# Patient Record
Sex: Female | Born: 1960 | Race: Black or African American | Hispanic: No | Marital: Married | Smoking: Never smoker
Health system: Southern US, Community
[De-identification: ages and names within clinical notes are randomized; demographics above are authoritative.]

---

## 1998-05-21 ENCOUNTER — Other Ambulatory Visit: Admission: RE | Admit: 1998-05-21 | Discharge: 1998-05-21 | Payer: Self-pay | Admitting: *Deleted

## 1998-10-18 ENCOUNTER — Other Ambulatory Visit: Admission: RE | Admit: 1998-10-18 | Discharge: 1998-10-18 | Payer: Self-pay | Admitting: *Deleted

## 2000-04-23 ENCOUNTER — Encounter: Admission: RE | Admit: 2000-04-23 | Discharge: 2000-04-23 | Payer: Self-pay | Admitting: Family Medicine

## 2000-04-23 ENCOUNTER — Encounter: Payer: Self-pay | Admitting: Family Medicine

## 2001-05-15 ENCOUNTER — Encounter: Admission: RE | Admit: 2001-05-15 | Discharge: 2001-05-15 | Payer: Self-pay | Admitting: Family Medicine

## 2001-05-15 ENCOUNTER — Encounter: Payer: Self-pay | Admitting: Family Medicine

## 2002-05-20 ENCOUNTER — Encounter: Payer: Self-pay | Admitting: Family Medicine

## 2002-05-20 ENCOUNTER — Encounter: Admission: RE | Admit: 2002-05-20 | Discharge: 2002-05-20 | Payer: Self-pay | Admitting: Family Medicine

## 2003-06-02 ENCOUNTER — Encounter: Admission: RE | Admit: 2003-06-02 | Discharge: 2003-06-02 | Payer: Self-pay | Admitting: Family Medicine

## 2003-06-16 ENCOUNTER — Ambulatory Visit (HOSPITAL_COMMUNITY): Admission: RE | Admit: 2003-06-16 | Discharge: 2003-06-16 | Payer: Self-pay | Admitting: Family Medicine

## 2003-06-30 ENCOUNTER — Ambulatory Visit (HOSPITAL_COMMUNITY): Admission: RE | Admit: 2003-06-30 | Discharge: 2003-06-30 | Payer: Self-pay | Admitting: Family Medicine

## 2004-06-28 ENCOUNTER — Ambulatory Visit (HOSPITAL_COMMUNITY): Admission: RE | Admit: 2004-06-28 | Discharge: 2004-06-28 | Payer: Self-pay | Admitting: Family Medicine

## 2004-06-28 ENCOUNTER — Other Ambulatory Visit: Admission: RE | Admit: 2004-06-28 | Discharge: 2004-06-28 | Payer: Self-pay | Admitting: Family Medicine

## 2005-06-28 ENCOUNTER — Other Ambulatory Visit: Admission: RE | Admit: 2005-06-28 | Discharge: 2005-06-28 | Payer: Self-pay | Admitting: Family Medicine

## 2005-07-04 ENCOUNTER — Ambulatory Visit (HOSPITAL_COMMUNITY): Admission: RE | Admit: 2005-07-04 | Discharge: 2005-07-04 | Payer: Self-pay | Admitting: Family Medicine

## 2006-06-28 ENCOUNTER — Other Ambulatory Visit: Admission: RE | Admit: 2006-06-28 | Discharge: 2006-06-28 | Payer: Self-pay | Admitting: Family Medicine

## 2006-08-01 ENCOUNTER — Ambulatory Visit (HOSPITAL_COMMUNITY): Admission: RE | Admit: 2006-08-01 | Discharge: 2006-08-01 | Payer: Self-pay | Admitting: Obstetrics & Gynecology

## 2007-07-01 ENCOUNTER — Other Ambulatory Visit: Admission: RE | Admit: 2007-07-01 | Discharge: 2007-07-01 | Payer: Self-pay | Admitting: Family Medicine

## 2007-08-07 ENCOUNTER — Ambulatory Visit (HOSPITAL_COMMUNITY): Admission: RE | Admit: 2007-08-07 | Discharge: 2007-08-07 | Payer: Self-pay | Admitting: Family Medicine

## 2008-06-30 ENCOUNTER — Other Ambulatory Visit: Admission: RE | Admit: 2008-06-30 | Discharge: 2008-06-30 | Payer: Self-pay | Admitting: Family Medicine

## 2008-08-11 ENCOUNTER — Ambulatory Visit (HOSPITAL_COMMUNITY): Admission: RE | Admit: 2008-08-11 | Discharge: 2008-08-11 | Payer: Self-pay | Admitting: Family Medicine

## 2009-08-02 ENCOUNTER — Other Ambulatory Visit: Admission: RE | Admit: 2009-08-02 | Discharge: 2009-08-02 | Payer: Self-pay | Admitting: Family Medicine

## 2009-08-26 ENCOUNTER — Ambulatory Visit (HOSPITAL_COMMUNITY): Admission: RE | Admit: 2009-08-26 | Discharge: 2009-08-26 | Payer: Self-pay | Admitting: Internal Medicine

## 2010-07-28 ENCOUNTER — Other Ambulatory Visit (HOSPITAL_COMMUNITY): Payer: Self-pay | Admitting: Family Medicine

## 2010-07-28 DIAGNOSIS — Z1239 Encounter for other screening for malignant neoplasm of breast: Secondary | ICD-10-CM

## 2010-07-28 DIAGNOSIS — Z1231 Encounter for screening mammogram for malignant neoplasm of breast: Secondary | ICD-10-CM

## 2010-08-03 ENCOUNTER — Other Ambulatory Visit: Payer: Self-pay | Admitting: Family Medicine

## 2010-08-03 ENCOUNTER — Other Ambulatory Visit (HOSPITAL_COMMUNITY)
Admission: RE | Admit: 2010-08-03 | Discharge: 2010-08-03 | Disposition: A | Discharge status: Home / Self Care | Payer: BC Managed Care – PPO | Source: Ambulatory Visit | Attending: Family Medicine | Admitting: Family Medicine

## 2010-08-03 DIAGNOSIS — Z124 Encounter for screening for malignant neoplasm of cervix: Secondary | ICD-10-CM | POA: Insufficient documentation

## 2010-08-29 ENCOUNTER — Ambulatory Visit (HOSPITAL_COMMUNITY): Admission: RE | Admit: 2010-08-29 | Payer: BC Managed Care – PPO | Source: Ambulatory Visit

## 2010-08-31 ENCOUNTER — Ambulatory Visit (HOSPITAL_COMMUNITY)
Admission: RE | Admit: 2010-08-31 | Discharge: 2010-08-31 | Disposition: A | Discharge status: Home / Self Care | Payer: BC Managed Care – PPO | Source: Ambulatory Visit | Attending: Family Medicine | Admitting: Family Medicine

## 2010-08-31 DIAGNOSIS — Z1231 Encounter for screening mammogram for malignant neoplasm of breast: Secondary | ICD-10-CM | POA: Insufficient documentation

## 2011-08-22 ENCOUNTER — Other Ambulatory Visit (HOSPITAL_COMMUNITY): Payer: Self-pay | Admitting: Family Medicine

## 2011-08-22 DIAGNOSIS — Z1231 Encounter for screening mammogram for malignant neoplasm of breast: Secondary | ICD-10-CM

## 2011-09-15 ENCOUNTER — Ambulatory Visit (HOSPITAL_COMMUNITY)
Admission: RE | Admit: 2011-09-15 | Discharge: 2011-09-15 | Disposition: A | Discharge status: Home / Self Care | Payer: BC Managed Care – PPO | Source: Ambulatory Visit | Attending: Family Medicine | Admitting: Family Medicine

## 2011-09-15 DIAGNOSIS — Z1231 Encounter for screening mammogram for malignant neoplasm of breast: Secondary | ICD-10-CM | POA: Insufficient documentation

## 2011-09-26 ENCOUNTER — Other Ambulatory Visit: Payer: Self-pay | Admitting: Family Medicine

## 2011-09-26 ENCOUNTER — Other Ambulatory Visit (HOSPITAL_COMMUNITY)
Admission: RE | Admit: 2011-09-26 | Discharge: 2011-09-26 | Disposition: A | Discharge status: Home / Self Care | Payer: BC Managed Care – PPO | Source: Ambulatory Visit | Attending: Family Medicine | Admitting: Family Medicine

## 2011-09-26 DIAGNOSIS — Z124 Encounter for screening for malignant neoplasm of cervix: Secondary | ICD-10-CM | POA: Insufficient documentation

## 2012-09-12 ENCOUNTER — Other Ambulatory Visit (HOSPITAL_COMMUNITY): Payer: Self-pay | Admitting: Family Medicine

## 2012-09-12 DIAGNOSIS — Z1231 Encounter for screening mammogram for malignant neoplasm of breast: Secondary | ICD-10-CM

## 2012-09-25 ENCOUNTER — Ambulatory Visit (HOSPITAL_COMMUNITY)
Admission: RE | Admit: 2012-09-25 | Discharge: 2012-09-25 | Disposition: A | Discharge status: Home / Self Care | Payer: BC Managed Care – PPO | Source: Ambulatory Visit | Attending: Family Medicine | Admitting: Family Medicine

## 2012-09-25 DIAGNOSIS — Z1231 Encounter for screening mammogram for malignant neoplasm of breast: Secondary | ICD-10-CM | POA: Insufficient documentation

## 2012-10-02 ENCOUNTER — Other Ambulatory Visit: Payer: Self-pay | Admitting: Family Medicine

## 2012-10-02 ENCOUNTER — Other Ambulatory Visit (HOSPITAL_COMMUNITY)
Admission: RE | Admit: 2012-10-02 | Discharge: 2012-10-02 | Disposition: A | Discharge status: Home / Self Care | Payer: BC Managed Care – PPO | Source: Ambulatory Visit | Attending: Family Medicine | Admitting: Family Medicine

## 2012-10-02 DIAGNOSIS — Z124 Encounter for screening for malignant neoplasm of cervix: Secondary | ICD-10-CM | POA: Insufficient documentation

## 2013-10-01 ENCOUNTER — Other Ambulatory Visit (HOSPITAL_COMMUNITY): Payer: Self-pay | Admitting: Family Medicine

## 2013-10-01 DIAGNOSIS — Z1231 Encounter for screening mammogram for malignant neoplasm of breast: Secondary | ICD-10-CM

## 2013-10-03 ENCOUNTER — Ambulatory Visit (HOSPITAL_COMMUNITY)
Admission: RE | Admit: 2013-10-03 | Discharge: 2013-10-03 | Disposition: A | Discharge status: Home / Self Care | Payer: BC Managed Care – PPO | Source: Ambulatory Visit | Attending: Family Medicine | Admitting: Family Medicine

## 2013-10-03 DIAGNOSIS — Z1231 Encounter for screening mammogram for malignant neoplasm of breast: Secondary | ICD-10-CM | POA: Insufficient documentation

## 2013-10-07 ENCOUNTER — Other Ambulatory Visit (HOSPITAL_COMMUNITY)
Admission: RE | Admit: 2013-10-07 | Discharge: 2013-10-07 | Disposition: A | Discharge status: Home / Self Care | Payer: BC Managed Care – PPO | Source: Ambulatory Visit | Attending: Family Medicine | Admitting: Family Medicine

## 2013-10-07 ENCOUNTER — Other Ambulatory Visit: Payer: Self-pay | Admitting: Family Medicine

## 2013-10-07 DIAGNOSIS — Z124 Encounter for screening for malignant neoplasm of cervix: Secondary | ICD-10-CM | POA: Insufficient documentation

## 2014-10-16 ENCOUNTER — Other Ambulatory Visit: Payer: Self-pay | Admitting: Gastroenterology

## 2014-11-04 ENCOUNTER — Other Ambulatory Visit (HOSPITAL_COMMUNITY): Payer: Self-pay | Admitting: Family Medicine

## 2014-11-04 DIAGNOSIS — Z1231 Encounter for screening mammogram for malignant neoplasm of breast: Secondary | ICD-10-CM

## 2014-11-11 ENCOUNTER — Ambulatory Visit (HOSPITAL_COMMUNITY)
Admission: RE | Admit: 2014-11-11 | Discharge: 2014-11-11 | Disposition: A | Discharge status: Home / Self Care | Payer: BLUE CROSS/BLUE SHIELD | Source: Ambulatory Visit | Attending: Family Medicine | Admitting: Family Medicine

## 2014-11-11 DIAGNOSIS — Z1231 Encounter for screening mammogram for malignant neoplasm of breast: Secondary | ICD-10-CM | POA: Insufficient documentation

## 2015-12-07 ENCOUNTER — Other Ambulatory Visit: Payer: Self-pay | Admitting: Family Medicine

## 2015-12-07 DIAGNOSIS — Z1231 Encounter for screening mammogram for malignant neoplasm of breast: Secondary | ICD-10-CM

## 2015-12-17 ENCOUNTER — Ambulatory Visit
Admission: RE | Admit: 2015-12-17 | Discharge: 2015-12-17 | Disposition: A | Discharge status: Home / Self Care | Payer: BLUE CROSS/BLUE SHIELD | Source: Ambulatory Visit | Attending: Family Medicine | Admitting: Family Medicine

## 2015-12-17 DIAGNOSIS — Z1231 Encounter for screening mammogram for malignant neoplasm of breast: Secondary | ICD-10-CM

## 2017-01-31 ENCOUNTER — Other Ambulatory Visit: Payer: Self-pay | Admitting: Family Medicine

## 2017-01-31 DIAGNOSIS — Z1231 Encounter for screening mammogram for malignant neoplasm of breast: Secondary | ICD-10-CM

## 2017-02-09 ENCOUNTER — Ambulatory Visit
Admission: RE | Admit: 2017-02-09 | Discharge: 2017-02-09 | Disposition: A | Discharge status: Home / Self Care | Payer: Self-pay | Source: Ambulatory Visit | Attending: Family Medicine | Admitting: Family Medicine

## 2017-02-09 DIAGNOSIS — Z1231 Encounter for screening mammogram for malignant neoplasm of breast: Secondary | ICD-10-CM

## 2017-12-14 ENCOUNTER — Other Ambulatory Visit: Payer: Self-pay | Admitting: Family Medicine

## 2017-12-14 ENCOUNTER — Other Ambulatory Visit (HOSPITAL_COMMUNITY)
Admission: RE | Admit: 2017-12-14 | Discharge: 2017-12-14 | Disposition: A | Discharge status: Home / Self Care | Payer: BLUE CROSS/BLUE SHIELD | Source: Ambulatory Visit | Attending: Family Medicine | Admitting: Family Medicine

## 2017-12-14 DIAGNOSIS — Z01419 Encounter for gynecological examination (general) (routine) without abnormal findings: Secondary | ICD-10-CM | POA: Diagnosis not present

## 2017-12-14 DIAGNOSIS — Z0000 Encounter for general adult medical examination without abnormal findings: Secondary | ICD-10-CM | POA: Diagnosis not present

## 2017-12-14 DIAGNOSIS — Z23 Encounter for immunization: Secondary | ICD-10-CM | POA: Diagnosis not present

## 2017-12-14 DIAGNOSIS — Z1159 Encounter for screening for other viral diseases: Secondary | ICD-10-CM | POA: Diagnosis not present

## 2017-12-14 DIAGNOSIS — E7800 Pure hypercholesterolemia, unspecified: Secondary | ICD-10-CM | POA: Diagnosis not present

## 2017-12-14 DIAGNOSIS — Z124 Encounter for screening for malignant neoplasm of cervix: Secondary | ICD-10-CM | POA: Diagnosis not present

## 2017-12-18 LAB — CYTOLOGY - PAP: Diagnosis: NEGATIVE

## 2018-01-09 ENCOUNTER — Encounter: Payer: BLUE CROSS/BLUE SHIELD | Admitting: Podiatry

## 2018-01-14 NOTE — Progress Notes (Signed)
This encounter was created in error - please disregard.

## 2018-01-17 ENCOUNTER — Ambulatory Visit (INDEPENDENT_AMBULATORY_CARE_PROVIDER_SITE_OTHER): Payer: BLUE CROSS/BLUE SHIELD

## 2018-01-17 ENCOUNTER — Encounter: Payer: Self-pay | Admitting: Podiatry

## 2018-01-17 ENCOUNTER — Other Ambulatory Visit: Payer: Self-pay | Admitting: Podiatry

## 2018-01-17 ENCOUNTER — Ambulatory Visit: Payer: BLUE CROSS/BLUE SHIELD | Admitting: Podiatry

## 2018-01-17 VITALS — HR 87 | Resp 16

## 2018-01-17 DIAGNOSIS — M79671 Pain in right foot: Secondary | ICD-10-CM

## 2018-01-17 DIAGNOSIS — M79672 Pain in left foot: Secondary | ICD-10-CM

## 2018-01-17 DIAGNOSIS — M722 Plantar fascial fibromatosis: Secondary | ICD-10-CM

## 2018-01-17 MED ORDER — DICLOFENAC SODIUM 75 MG PO TBEC: 75.0000 mg | DELAYED_RELEASE_TABLET | Freq: Two times a day (BID) | ORAL | 2 refills | Status: DC

## 2018-01-17 NOTE — Progress Notes (Signed)
   Subjective:    Patient ID: Tracey Callahan, female    DOB: March 01, 1961, 57 y.o.   MRN: [id number]  HPI    Review of Systems  All other systems reviewed and are negative.      Objective:   Physical Exam        Assessment & Plan:

## 2018-01-17 NOTE — Patient Instructions (Signed)

## 2018-01-18 NOTE — Progress Notes (Signed)
Subjective:   Patient ID: Tracey Callahan, female   DOB: 57 y.o.   MRN: [id number]   HPI Patient presents stating that the bottom of my heels at times bother me and it feels like the circulation stops and then they seem to get better.  It is hard to describe what the pain is exactly but it does at times bother me.  Patient does not smoke likes to be active    Review of Systems  All other systems reviewed and are negative.       Objective:  Physical Exam  Constitutional: She appears well-developed and well-nourished.  Cardiovascular: Intact distal pulses.  Pulmonary/Chest: Effort normal.  Musculoskeletal: Normal range of motion.  Neurological: She is alert.  Skin: Skin is warm.  Nursing note and vitals reviewed.   Neurovascular status intact muscle strength adequate patient found to have minimal discomfort plantar aspect both heels with only mild pain upon deep palpation.  Patient has good digital perfusion well oriented with no indications of circulatory disease     Assessment:  Probability for mild to moderate fasciitis but difficult to make complete determination     Plan:  I went ahead today and I recommended supportive shoes over-the-counter insoles and I placed on diclofenac 75 mg twice daily.  Reappoint and may require injection treatment customized orthotics or other treatments  X-rays were negative for signs of spurs or stress fracture and no arthritis was noted

## 2018-02-27 ENCOUNTER — Other Ambulatory Visit: Payer: Self-pay

## 2018-02-27 DIAGNOSIS — Z1231 Encounter for screening mammogram for malignant neoplasm of breast: Secondary | ICD-10-CM

## 2018-04-01 ENCOUNTER — Ambulatory Visit
Admission: RE | Admit: 2018-04-01 | Discharge: 2018-04-01 | Disposition: A | Discharge status: Home / Self Care | Payer: BLUE CROSS/BLUE SHIELD | Source: Ambulatory Visit | Attending: Family Medicine | Admitting: Family Medicine

## 2018-04-01 ENCOUNTER — Encounter: Payer: Self-pay | Admitting: Radiology

## 2018-04-01 DIAGNOSIS — Z1231 Encounter for screening mammogram for malignant neoplasm of breast: Secondary | ICD-10-CM | POA: Diagnosis not present

## 2018-07-01 DIAGNOSIS — G5761 Lesion of plantar nerve, right lower limb: Secondary | ICD-10-CM | POA: Diagnosis not present

## 2018-07-08 DIAGNOSIS — G5761 Lesion of plantar nerve, right lower limb: Secondary | ICD-10-CM | POA: Diagnosis not present

## 2018-07-08 DIAGNOSIS — M7751 Other enthesopathy of right foot: Secondary | ICD-10-CM | POA: Diagnosis not present

## 2018-11-21 DIAGNOSIS — G5761 Lesion of plantar nerve, right lower limb: Secondary | ICD-10-CM | POA: Diagnosis not present

## 2018-11-21 DIAGNOSIS — M7751 Other enthesopathy of right foot: Secondary | ICD-10-CM | POA: Diagnosis not present

## 2018-12-20 DIAGNOSIS — Z0000 Encounter for general adult medical examination without abnormal findings: Secondary | ICD-10-CM | POA: Diagnosis not present

## 2018-12-27 DIAGNOSIS — E7800 Pure hypercholesterolemia, unspecified: Secondary | ICD-10-CM | POA: Diagnosis not present

## 2018-12-27 DIAGNOSIS — Z6837 Body mass index (BMI) 37.0-37.9, adult: Secondary | ICD-10-CM | POA: Diagnosis not present

## 2018-12-27 DIAGNOSIS — Z79899 Other long term (current) drug therapy: Secondary | ICD-10-CM | POA: Diagnosis not present

## 2019-03-24 ENCOUNTER — Other Ambulatory Visit: Payer: Self-pay | Admitting: Family Medicine

## 2019-03-24 DIAGNOSIS — Z1231 Encounter for screening mammogram for malignant neoplasm of breast: Secondary | ICD-10-CM

## 2019-05-06 ENCOUNTER — Other Ambulatory Visit: Payer: Self-pay

## 2019-05-06 ENCOUNTER — Ambulatory Visit
Admission: RE | Admit: 2019-05-06 | Discharge: 2019-05-06 | Disposition: A | Discharge status: Home / Self Care | Payer: BC Managed Care – PPO | Source: Ambulatory Visit | Attending: Family Medicine | Admitting: Family Medicine

## 2019-05-06 DIAGNOSIS — Z1231 Encounter for screening mammogram for malignant neoplasm of breast: Secondary | ICD-10-CM

## 2019-10-03 ENCOUNTER — Ambulatory Visit: Payer: BC Managed Care – PPO | Attending: Internal Medicine

## 2019-10-03 DIAGNOSIS — Z23 Encounter for immunization: Secondary | ICD-10-CM

## 2019-10-03 NOTE — Progress Notes (Signed)
   Covid-19 Vaccination Clinic  Name:  Tracey Callahan    MRN: [id number] DOB: [birth year]  10/03/2019  Tracey Callahan was observed post Covid-19 immunization for 15 minutes without incident. She was provided with Vaccine Information Sheet and instruction to access the V-Safe system.   Tracey Callahan was instructed to call 911 with any severe reactions post vaccine: Marland Kitchen Difficulty breathing  . Swelling of face and throat  . A fast heartbeat  . A bad rash all over body  . Dizziness and weakness   Immunizations Administered    Name Date Dose VIS Date Route   Pfizer COVID-19 Vaccine 10/03/2019 12:08 PM 0.3 mL 06/13/2019 Intramuscular   Manufacturer: [employer], [employer]   Lot: [id number]   NDC: [address]-0

## 2019-10-29 ENCOUNTER — Ambulatory Visit: Payer: BC Managed Care – PPO | Attending: Internal Medicine

## 2019-10-29 DIAGNOSIS — Z23 Encounter for immunization: Secondary | ICD-10-CM

## 2019-10-29 NOTE — Progress Notes (Signed)
   Covid-19 Vaccination Clinic  Name:  DALICIA KISNER    MRN: [id number] DOB: 1960/08/20  10/29/2019  Ms. Guerrera was observed post Covid-19 immunization for 15 minutes without incident. She was provided with Vaccine Information Sheet and instruction to access the V-Safe system.   Ms. Gores was instructed to call 911 with any severe reactions post vaccine: Marland Kitchen Difficulty breathing  . Swelling of face and throat  . A fast heartbeat  . A bad rash all over body  . Dizziness and weakness   Immunizations Administered    Name Date Dose VIS Date Route   Pfizer COVID-19 Vaccine 10/29/2019  4:42 PM 0.3 mL 08/27/2018 Intramuscular   Manufacturer: [employer], [employer]   Lot: [id number]   NDC: [address]-8

## 2020-01-08 DIAGNOSIS — E7800 Pure hypercholesterolemia, unspecified: Secondary | ICD-10-CM | POA: Diagnosis not present

## 2020-01-08 DIAGNOSIS — Z0000 Encounter for general adult medical examination without abnormal findings: Secondary | ICD-10-CM | POA: Diagnosis not present

## 2020-04-22 ENCOUNTER — Other Ambulatory Visit: Payer: Self-pay | Admitting: Family Medicine

## 2020-04-22 DIAGNOSIS — Z1231 Encounter for screening mammogram for malignant neoplasm of breast: Secondary | ICD-10-CM

## 2020-05-21 ENCOUNTER — Ambulatory Visit
Admission: RE | Admit: 2020-05-21 | Discharge: 2020-05-21 | Disposition: A | Discharge status: Home / Self Care | Payer: BC Managed Care – PPO | Source: Ambulatory Visit | Attending: Family Medicine | Admitting: Family Medicine

## 2020-05-21 ENCOUNTER — Other Ambulatory Visit: Payer: Self-pay

## 2020-05-21 DIAGNOSIS — Z1231 Encounter for screening mammogram for malignant neoplasm of breast: Secondary | ICD-10-CM

## 2021-01-20 DIAGNOSIS — Z124 Encounter for screening for malignant neoplasm of cervix: Secondary | ICD-10-CM | POA: Diagnosis not present

## 2021-01-20 DIAGNOSIS — E7800 Pure hypercholesterolemia, unspecified: Secondary | ICD-10-CM | POA: Diagnosis not present

## 2021-01-20 DIAGNOSIS — Z0000 Encounter for general adult medical examination without abnormal findings: Secondary | ICD-10-CM | POA: Diagnosis not present

## 2021-05-02 ENCOUNTER — Other Ambulatory Visit: Payer: Self-pay | Admitting: Family Medicine

## 2021-05-02 DIAGNOSIS — Z1231 Encounter for screening mammogram for malignant neoplasm of breast: Secondary | ICD-10-CM

## 2021-06-10 ENCOUNTER — Ambulatory Visit
Admission: RE | Admit: 2021-06-10 | Discharge: 2021-06-10 | Disposition: A | Discharge status: Home / Self Care | Payer: BC Managed Care – PPO | Source: Ambulatory Visit | Attending: Family Medicine | Admitting: Family Medicine

## 2021-06-10 ENCOUNTER — Other Ambulatory Visit: Payer: Self-pay

## 2021-06-10 DIAGNOSIS — Z1231 Encounter for screening mammogram for malignant neoplasm of breast: Secondary | ICD-10-CM

## 2022-01-25 DIAGNOSIS — E7800 Pure hypercholesterolemia, unspecified: Secondary | ICD-10-CM | POA: Diagnosis not present

## 2022-01-25 DIAGNOSIS — Z0000 Encounter for general adult medical examination without abnormal findings: Secondary | ICD-10-CM | POA: Diagnosis not present

## 2022-07-26 ENCOUNTER — Other Ambulatory Visit: Payer: Self-pay | Admitting: Family Medicine

## 2022-07-26 DIAGNOSIS — Z1231 Encounter for screening mammogram for malignant neoplasm of breast: Secondary | ICD-10-CM

## 2022-09-14 ENCOUNTER — Ambulatory Visit
Admission: RE | Admit: 2022-09-14 | Discharge: 2022-09-14 | Disposition: A | Discharge status: Home / Self Care | Payer: BC Managed Care – PPO | Source: Ambulatory Visit | Attending: Family Medicine | Admitting: Family Medicine

## 2022-09-14 DIAGNOSIS — Z1231 Encounter for screening mammogram for malignant neoplasm of breast: Secondary | ICD-10-CM

## 2023-01-23 IMAGING — MG MM DIGITAL SCREENING BILAT W/ TOMO AND CAD
6 of 10 series · 6 of 30 positions shown · non-contrast
Comparison: Previous exam(s).

CLINICAL DATA: Screening.

EXAM:
DIGITAL SCREENING BILATERAL MAMMOGRAM WITH TOMOSYNTHESIS AND CAD
TECHNIQUE: Bilateral screening digital craniocaudal and mediolateral oblique
mammograms were obtained. Bilateral screening digital breast
tomosynthesis was performed. The images were evaluated with
computer-aided detection.

[L CC synth-2D]
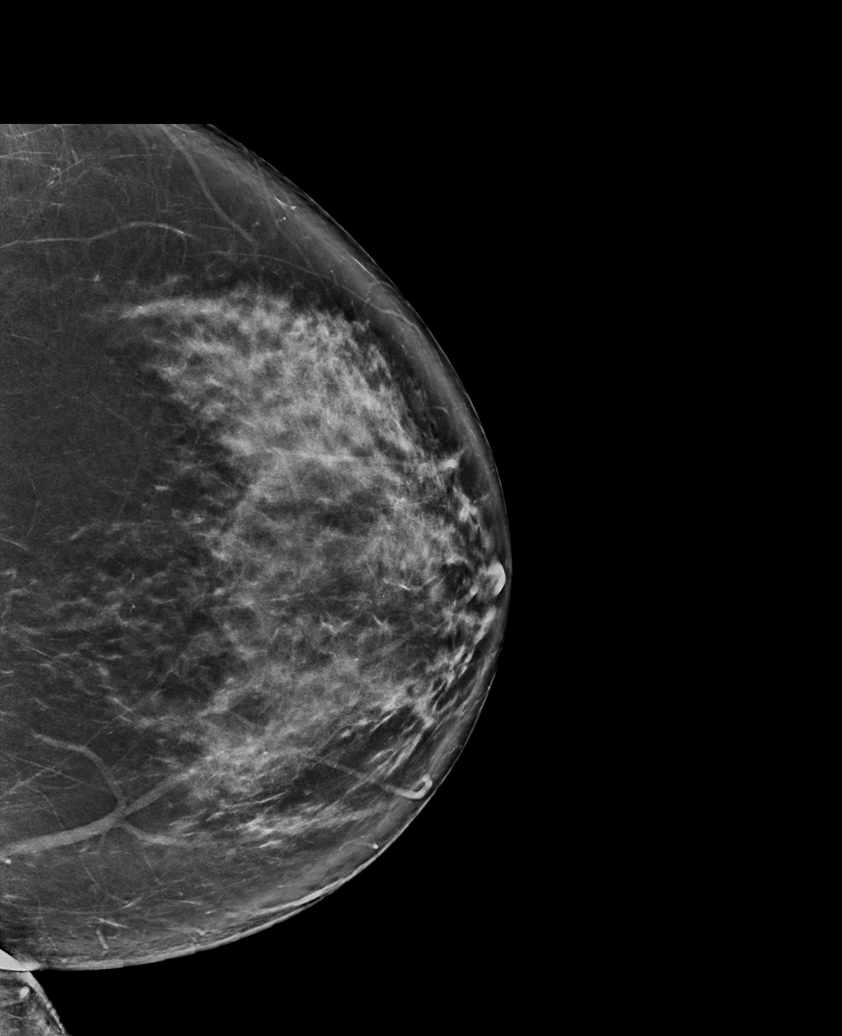

[R MLO synth-2D]
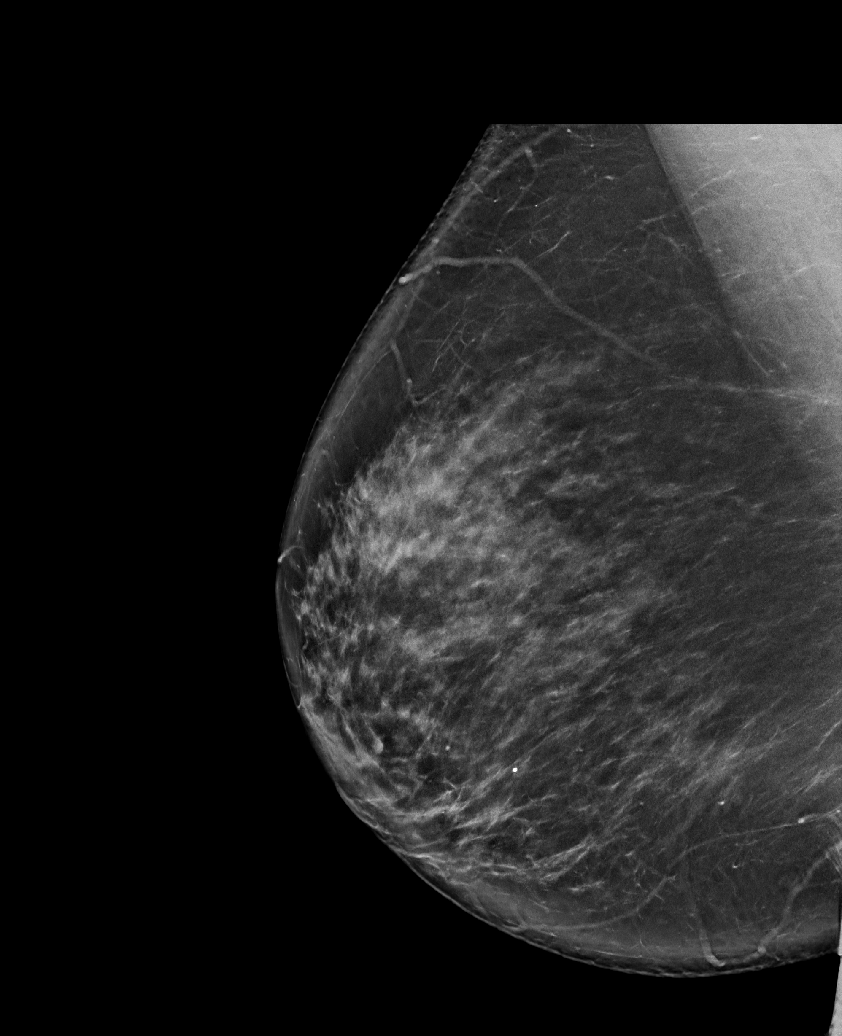

[L MLO synth-2D (1 of 2)]
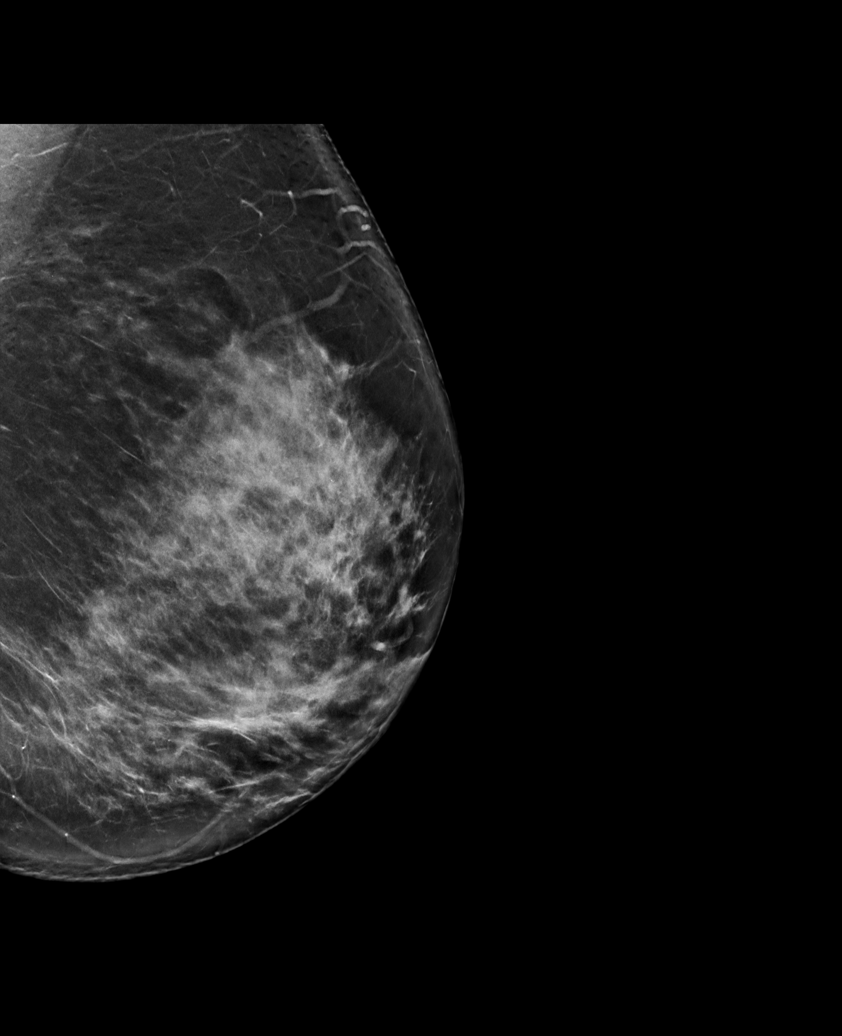

[L MLO synth-2D (2 of 2)]
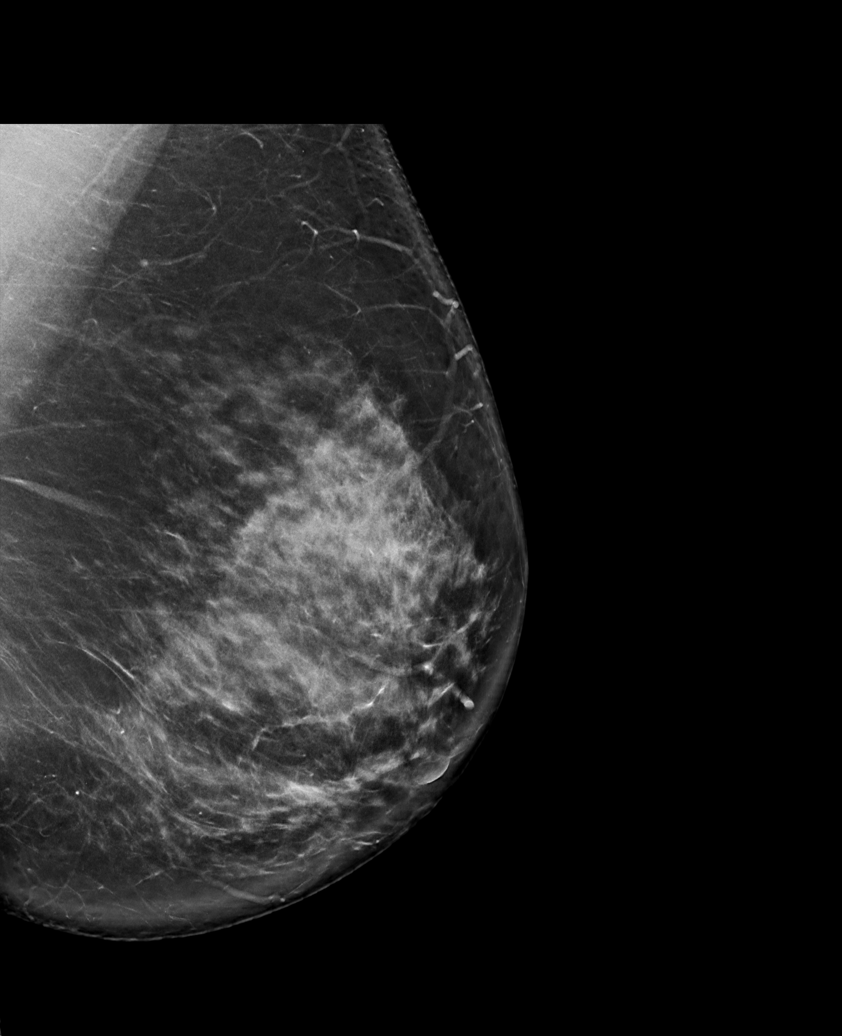

[R CC synth-2D]
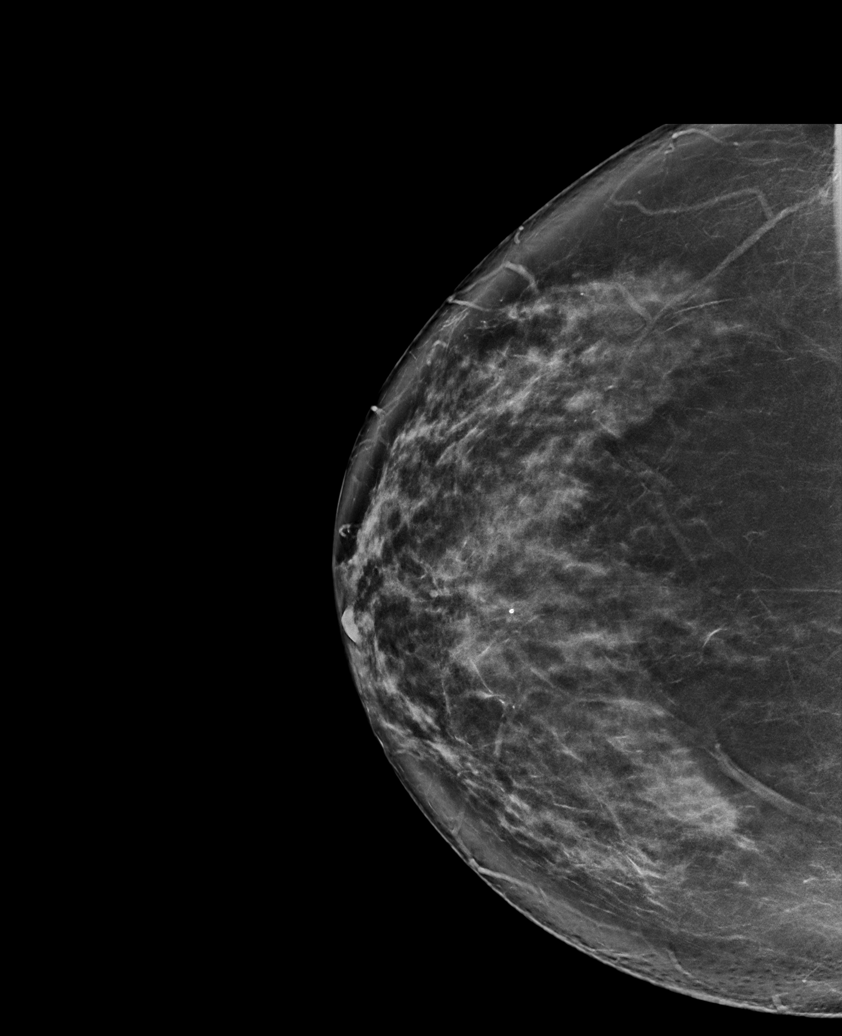

[L CC tomo · tomo slice 47/93.0]
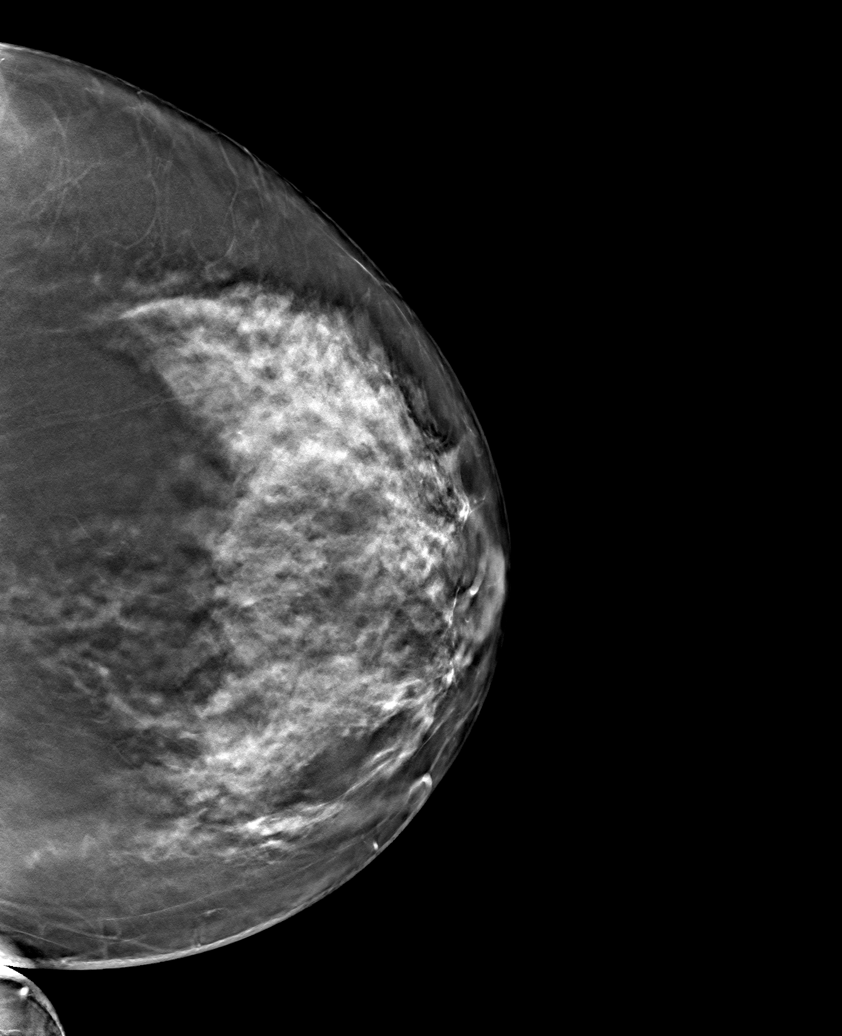

[6 of 30 positions shown; findings below may reference images not displayed]

ACR Breast Density Category c: The breast tissue is heterogeneously
dense, which may obscure small masses.
FINDINGS: There are no findings suspicious for malignancy.
IMPRESSION: No mammographic evidence of malignancy. A result letter of this
screening mammogram will be mailed directly to the patient.

RECOMMENDATION:
Screening mammogram in one year. (Code:[id number])

BI-RADS CATEGORY  1: Negative.

## 2023-03-29 DIAGNOSIS — E7800 Pure hypercholesterolemia, unspecified: Secondary | ICD-10-CM | POA: Diagnosis not present

## 2023-03-29 DIAGNOSIS — Z0000 Encounter for general adult medical examination without abnormal findings: Secondary | ICD-10-CM | POA: Diagnosis not present

## 2023-03-29 DIAGNOSIS — Z23 Encounter for immunization: Secondary | ICD-10-CM | POA: Diagnosis not present

## 2023-06-11 ENCOUNTER — Other Ambulatory Visit: Payer: Self-pay | Admitting: Physician Assistant

## 2023-06-11 ENCOUNTER — Ambulatory Visit
Admission: RE | Admit: 2023-06-11 | Discharge: 2023-06-11 | Disposition: A | Discharge status: Home / Self Care | Payer: BC Managed Care – PPO | Source: Ambulatory Visit | Attending: Physician Assistant | Admitting: Physician Assistant

## 2023-06-11 DIAGNOSIS — M79652 Pain in left thigh: Secondary | ICD-10-CM

## 2023-06-11 DIAGNOSIS — E7800 Pure hypercholesterolemia, unspecified: Secondary | ICD-10-CM | POA: Diagnosis not present

## 2023-07-12 NOTE — Progress Notes (Signed)
 I, Leotis Batter, CMA acting as a scribe for Artist Lloyd, MD.  Tracey Callahan is a 63 y.o. female who presents to [facility] at [facility] today for L leg pain x 2 months. Pt locates pain to the left thigh, negative x-ray. Describes discomfort as tightness. No known injury. Sx wax and wan, worse with ambulation.   Radiates: no Aggravates: ambulation Treatments tried: Tylenol, Motrin, heat  Dx imaging: 06/11/23 L femur XR  Pertinent review of systems: No fever or chills  Relevant historical information: Obesity and hyperlipidemia   Exam:  BP 138/88   Pulse 69   Ht 5' 6 (1.676 m)   Wt 268 lb (121.6 kg)   LMP 11/03/2014   SpO2 98%   BMI 43.26 kg/m  General: Well Developed, well nourished, and in no acute distress.   MSK: Left thigh normal-appearing Non tender. Intact strength to hip flexion and knee extension.  Left hip normal-appearing limited range of motion lacks full flexion and rotation but not particularly painful.  Hip flexion and abduction strength is intact.  Knee range of motion and strength are intact.     Lab and Radiology Results  X-ray images lumbar spine and left hip obtained today personally and independently interpreted.  Lumbar spine: Tear listhesis L4-L5.  No acute fractures are visible.  DDD present L5-S1.  Left hip: Moderate left hip DJD.  No acute fractures are visible.  Await formal radiology review  EXAM: LEFT FEMUR 2 VIEWS   COMPARISON:  None Available.   FINDINGS: Intact left femur without acute osseous finding, fracture or malalignment. Slight joint space loss and sclerosis of the left hip joint. Minimal sclerosis of the midline symphysis noted. Visualized SI joints are unremarkable. 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   IMPRESSION: 1. No acute finding by plain radiography. 2. Mild degenerative changes as above.     Electronically Signed   By: CHRISTELLA.  Shick M.D.    On: 06/17/2023 12:05   Assessment and Plan: 63 y.o. female with chronic left anterior thigh pain worse with activity.  Etiology is unclear at this time.  This was first brought to medical attention in December 9 where she had an x-ray of the left femur that was unremarkable.  Since then her symptoms have persisted despite trials of conservative management.  Differential today includes pain referred from left hip arthritis and lumbar radiculopathy at the left L3.  Ideally I would like to do formal physical therapy but she does not think her schedule will allow it.  Plan for prednisone  and gabapentin .  Recheck in 1 month.  If worsening or if not improved neck step would be either interarticular hip injection or MRI lumbar spine or some combination of both.   PDMP not reviewed this encounter. Orders Placed This Encounter  Procedures   DG Lumbar Spine 2-3 Views    Standing Status:   Future    Number of Occurrences:   1    Expiration Date:   08/13/2023    Reason for Exam (SYMPTOM  OR DIAGNOSIS REQUIRED):   lumbar radiculopathy    Preferred imaging location?:   [facility]   DG HIP UNILAT W OR W/O PELVIS 2-3 VIEWS LEFT    Standing Status:   Future    Number of Occurrences:   1    Expiration Date:   08/13/2023    Reason for Exam (SYMPTOM  OR DIAGNOSIS REQUIRED):   left hip pain  Preferred imaging location?:   [place] [facility]   Meds ordered this encounter  Medications   predniSONE  (DELTASONE ) 50 MG tablet    Sig: Take 1 pill daily for 5 days    Dispense:  5 tablet    Refill:  0   gabapentin  (NEURONTIN ) 300 MG capsule    Sig: Take 1 capsule (300 mg total) by mouth 3 (three) times daily as needed.    Dispense:  90 capsule    Refill:  1     Discussed warning signs or symptoms. Please see discharge instructions. Patient expresses understanding.   The above documentation has been reviewed and is accurate and complete Artist Lloyd, M.D.

## 2023-07-13 ENCOUNTER — Encounter: Payer: Self-pay | Admitting: Family Medicine

## 2023-07-13 ENCOUNTER — Ambulatory Visit (INDEPENDENT_AMBULATORY_CARE_PROVIDER_SITE_OTHER): Payer: BC Managed Care – PPO

## 2023-07-13 ENCOUNTER — Ambulatory Visit: Payer: BC Managed Care – PPO | Admitting: Family Medicine

## 2023-07-13 VITALS — BP 138/88 | HR 69 | Ht 66.0 in | Wt 268.0 lb

## 2023-07-13 DIAGNOSIS — M4726 Other spondylosis with radiculopathy, lumbar region: Secondary | ICD-10-CM | POA: Diagnosis not present

## 2023-07-13 DIAGNOSIS — M439 Deforming dorsopathy, unspecified: Secondary | ICD-10-CM | POA: Diagnosis not present

## 2023-07-13 DIAGNOSIS — M4316 Spondylolisthesis, lumbar region: Secondary | ICD-10-CM | POA: Diagnosis not present

## 2023-07-13 DIAGNOSIS — E785 Hyperlipidemia, unspecified: Secondary | ICD-10-CM | POA: Insufficient documentation

## 2023-07-13 DIAGNOSIS — M79652 Pain in left thigh: Secondary | ICD-10-CM | POA: Diagnosis not present

## 2023-07-13 DIAGNOSIS — M5416 Radiculopathy, lumbar region: Secondary | ICD-10-CM

## 2023-07-13 DIAGNOSIS — M25552 Pain in left hip: Secondary | ICD-10-CM | POA: Diagnosis not present

## 2023-07-13 DIAGNOSIS — M1612 Unilateral primary osteoarthritis, left hip: Secondary | ICD-10-CM | POA: Diagnosis not present

## 2023-07-13 MED ORDER — GABAPENTIN 300 MG PO CAPS: 300.0000 mg | ORAL_CAPSULE | Freq: Three times a day (TID) | ORAL | 1 refills | Status: AC | PRN

## 2023-07-13 MED ORDER — PREDNISONE 50 MG PO TABS: ORAL_TABLET | ORAL | 0 refills | Status: DC

## 2023-07-13 NOTE — Patient Instructions (Addendum)
 Thank you for coming in today.   Please get an Xray today before you leave   I've sent a prescription for Gabapentin & Prednisone to your pharmacy.   Check back in 1 month

## 2023-07-16 NOTE — Progress Notes (Signed)
 Low back x-ray shows mild arthritis

## 2023-07-17 ENCOUNTER — Ambulatory Visit: Payer: BC Managed Care – PPO | Admitting: Family Medicine

## 2023-07-23 ENCOUNTER — Encounter: Payer: Self-pay | Admitting: Family Medicine

## 2023-07-23 NOTE — Progress Notes (Signed)
Left hip x-ray shows mild arthritis.  Otherwise the x-ray looks okay.

## 2023-08-16 NOTE — Progress Notes (Unsigned)
Tracey Payor, PhD, LAT, ATC acting as a scribe for Tracey Graham, MD.  Tracey Callahan is a 63 y.o. female who presents to [facility] at [facility] today for f/u L thigh pain. Pt was last seen by Dr. Denyse Amass on 07/13/23 and was prescribed prednisone and gabapentin.  Today, pt reports L thigh pain is improving some, but still not doing great. She notes weakness in the L leg and she tries to limit pressure on the L, so she doesn't fall. Pt locates pain to the L crease of her hip and along the anterior-lateral aspect of the thigh.   Dx imaging: 07/13/23 L hip & L-spine XR 06/11/23 L femur XR   Pertinent review of systems: No fevers or chills  Relevant historical information: Hyperlipidemia   Exam:  BP 132/88   Pulse 77   Ht 5\' 6"  (1.676 m)   Wt 265 lb (120.2 kg)   LMP 11/03/2014   SpO2 98%   BMI 42.77 kg/m  General: Well Developed, well nourished, and in no acute distress.   MSK: Left hip normal-appearing normal motion pain with flexion.    Lab and Radiology Results  Procedure: Real-time Ultrasound Guided Injection of left hip joint Device: Philips Affiniti 50G/GE Logiq Images permanently stored and available for review in PACS Verbal informed consent obtained.  Discussed risks and benefits of procedure. Warned about infection, bleeding, hyperglycemia damage to structures among others. Patient expresses understanding and agreement Time-out conducted.   Noted no overlying erythema, induration, or other signs of local infection.   Skin prepped in a sterile fashion.   Local anesthesia: Topical Ethyl chloride.   With sterile technique and under real time ultrasound guidance: 40 mg of Kenalog and 2 mL of Marcaine injected into hip joint. Fluid seen entering the joint capsule.   Completed without difficulty   Pain immediately resolved suggesting accurate placement of the medication.   Advised to call if fevers/chills, erythema, induration, drainage, or persistent  bleeding.   Images permanently stored and available for review in the ultrasound unit.  Impression: Technically successful ultrasound guided injection.    EXAM: DG HIP (WITH OR WITHOUT PELVIS) 2-3V LEFT   COMPARISON:  June 11, 2023.   FINDINGS: There is no evidence of hip fracture or dislocation. Mild narrowing and osteophyte formation of the left hip is noted.   IMPRESSION: Mild degenerative joint disease of left hip. No acute abnormality seen.     Electronically Signed   By: Lupita Raider M.D.   On: 07/22/2023 10:12 I, Tracey Callahan, personally (independently) visualized and performed the interpretation of the images attached in this note.     Assessment and Plan: 63 y.o. female with left hip pain due to DJD.  She does have mild DJD seen on x-ray although my read shows the x-ray a little worse than radiologist interpretation.  She had immediate very good pain response to intra-articular injection today indicating that her pain is coming from the hip joint.  We can repeat this injection in 3 months if needed.  If it does not last that long she may need to start thinking surgery.  We talked a bit about hip replacement surgery.  BMI currently 42.7.  She needs to give it under 40.  That is about 20 pounds.   PDMP not reviewed this encounter. Orders Placed This Encounter  Procedures   [place] LIMITED JOINT SPACE STRUCTURES LOW LEFT(NO LINKED CHARGES)    Reason for Exam (SYMPTOM  OR  DIAGNOSIS REQUIRED):   L LE pain    Preferred imaging location?:   [facility]   No orders of the defined types were placed in this encounter.    Discussed warning signs or symptoms. Please see discharge instructions. Patient expresses understanding.   The above documentation has been reviewed and is accurate and complete Tracey Callahan, M.D.

## 2023-08-17 ENCOUNTER — Ambulatory Visit: Payer: BC Managed Care – PPO | Admitting: Family Medicine

## 2023-08-17 ENCOUNTER — Other Ambulatory Visit: Payer: Self-pay

## 2023-08-17 VITALS — BP 132/88 | HR 77 | Ht 66.0 in | Wt 265.0 lb

## 2023-08-17 DIAGNOSIS — M1612 Unilateral primary osteoarthritis, left hip: Secondary | ICD-10-CM | POA: Diagnosis not present

## 2023-08-17 DIAGNOSIS — M25552 Pain in left hip: Secondary | ICD-10-CM | POA: Diagnosis not present

## 2023-08-17 NOTE — Patient Instructions (Addendum)
Thank you for coming in today.   You received an injection today. Seek immediate medical attention if the joint becomes red, extremely painful, or is oozing fluid.   Check back as needed

## 2023-10-01 ENCOUNTER — Other Ambulatory Visit: Payer: Self-pay | Admitting: Internal Medicine

## 2023-10-01 DIAGNOSIS — Z1231 Encounter for screening mammogram for malignant neoplasm of breast: Secondary | ICD-10-CM

## 2023-10-17 ENCOUNTER — Ambulatory Visit
Admission: RE | Admit: 2023-10-17 | Discharge: 2023-10-17 | Disposition: A | Discharge status: Home / Self Care | Source: Ambulatory Visit | Attending: Internal Medicine | Admitting: Internal Medicine

## 2023-10-17 DIAGNOSIS — Z1231 Encounter for screening mammogram for malignant neoplasm of breast: Secondary | ICD-10-CM

## 2023-11-15 NOTE — Progress Notes (Unsigned)
   Joanna Muck, PhD, LAT, ATC acting as a scribe for Garlan Juniper, MD.  Tracey Callahan is a 63 y.o. female who presents to [facility] at [facility] today for 48-month f/u L hip pain. Pt was last seen by Dr. Alease Hunter on 08/17/23 and was given a L inter-articular hip injection.  Today, pt reports she is feeling great. L hip isn't bothering her.  The injection was very helpful.  Dx imaging: 07/13/23 L hip & L-spine XR 06/11/23 L femur XR   Pertinent review of systems: No fevers or chills  Relevant historical information: Obesity of hyperlipidemia.   Exam:  BP 128/86   Pulse 90   Ht 5\' 6"  (1.676 m)   Wt 267 lb (121.1 kg)   LMP 11/03/2014   SpO2 94%   BMI 43.09 kg/m  General: Well Developed, well nourished, and in no acute distress.   MSK: Left hip normal motion normal gait.    Lab and Radiology Results      Assessment and Plan: 63 y.o. female with chronic left hip and thigh pain.  Patient had a great response to the intra-articular steroid injection left hip.  She still feeling better more than 3 months after the last shot which would indicate that her pain probably was coming from the hip but there may have been some compensation around the hip including spasm of hip flexors.  Plan to continue weightbearing activity and exercise as tolerated.  Happy to do another shot whenever she needs me to.  We did talk about BMI a bit.  Her BMI is right around 43 today.  In order to have a hip replacement in the future she will need to get her BMI under 40 which is about 15 to 20 pounds.   PDMP not reviewed this encounter. No orders of the defined types were placed in this encounter.  No orders of the defined types were placed in this encounter.    Discussed warning signs or symptoms. Please see discharge instructions. Patient expresses understanding.   The above documentation has been reviewed and is accurate and complete Garlan Juniper, M.D.

## 2023-11-16 ENCOUNTER — Ambulatory Visit: Payer: BC Managed Care – PPO | Admitting: Family Medicine

## 2023-11-16 VITALS — BP 128/86 | HR 90 | Ht 66.0 in | Wt 267.0 lb

## 2023-11-16 DIAGNOSIS — M25552 Pain in left hip: Secondary | ICD-10-CM | POA: Diagnosis not present

## 2023-11-16 NOTE — Patient Instructions (Addendum)
 Thank you for coming in today.   Return as needed.   We can do the injection again whenever we need to.

## 2024-04-08 DIAGNOSIS — Z0000 Encounter for general adult medical examination without abnormal findings: Secondary | ICD-10-CM | POA: Diagnosis not present

## 2024-04-08 DIAGNOSIS — E7800 Pure hypercholesterolemia, unspecified: Secondary | ICD-10-CM | POA: Diagnosis not present

## 2024-04-08 DIAGNOSIS — Z23 Encounter for immunization: Secondary | ICD-10-CM | POA: Diagnosis not present

## 2024-04-11 DIAGNOSIS — Z23 Encounter for immunization: Secondary | ICD-10-CM | POA: Diagnosis not present

## 2024-06-20 DIAGNOSIS — Z23 Encounter for immunization: Secondary | ICD-10-CM | POA: Diagnosis not present
